# Patient Record
Sex: Male | Born: 1970 | Race: Black or African American | Hispanic: No | Marital: Single
Health system: Midwestern US, Community
[De-identification: ages and names within clinical notes are randomized; demographics above are authoritative.]

## PROBLEM LIST (undated history)

## (undated) DIAGNOSIS — W3400XA Accidental discharge from unspecified firearms or gun, initial encounter: Secondary | ICD-10-CM

## (undated) DIAGNOSIS — E7800 Pure hypercholesterolemia, unspecified: Secondary | ICD-10-CM

## (undated) DIAGNOSIS — I639 Cerebral infarction, unspecified: Secondary | ICD-10-CM

## (undated) DIAGNOSIS — I10 Essential (primary) hypertension: Secondary | ICD-10-CM

## (undated) DIAGNOSIS — E119 Type 2 diabetes mellitus without complications: Secondary | ICD-10-CM

## (undated) DIAGNOSIS — Y249XXA Unspecified firearm discharge, undetermined intent, initial encounter: Secondary | ICD-10-CM

## (undated) HISTORY — PX: SPLENECTOMY: SUR1306

## (undated) HISTORY — PX: ABDOMINAL SURGERY: SHX537

## (undated) HISTORY — PX: KIDNEY SURGERY: SHX687

---

## 2015-05-12 ENCOUNTER — Encounter (HOSPITAL_BASED_OUTPATIENT_CLINIC_OR_DEPARTMENT_OTHER): Payer: Self-pay

## 2015-05-12 ENCOUNTER — Emergency Department (HOSPITAL_BASED_OUTPATIENT_CLINIC_OR_DEPARTMENT_OTHER)
Admission: EM | Admit: 2015-05-12 | Discharge: 2015-05-12 | Disposition: A | Discharge status: Home / Self Care | Payer: Self-pay | Attending: Emergency Medicine | Admitting: Emergency Medicine

## 2015-05-12 ENCOUNTER — Emergency Department (HOSPITAL_BASED_OUTPATIENT_CLINIC_OR_DEPARTMENT_OTHER): Payer: Self-pay

## 2015-05-12 DIAGNOSIS — Z8673 Personal history of transient ischemic attack (TIA), and cerebral infarction without residual deficits: Secondary | ICD-10-CM | POA: Insufficient documentation

## 2015-05-12 DIAGNOSIS — F17210 Nicotine dependence, cigarettes, uncomplicated: Secondary | ICD-10-CM | POA: Insufficient documentation

## 2015-05-12 DIAGNOSIS — I10 Essential (primary) hypertension: Secondary | ICD-10-CM | POA: Insufficient documentation

## 2015-05-12 DIAGNOSIS — R21 Rash and other nonspecific skin eruption: Secondary | ICD-10-CM | POA: Insufficient documentation

## 2015-05-12 DIAGNOSIS — K122 Cellulitis and abscess of mouth: Secondary | ICD-10-CM | POA: Insufficient documentation

## 2015-05-12 DIAGNOSIS — J020 Streptococcal pharyngitis: Secondary | ICD-10-CM | POA: Insufficient documentation

## 2015-05-12 DIAGNOSIS — H109 Unspecified conjunctivitis: Secondary | ICD-10-CM | POA: Insufficient documentation

## 2015-05-12 DIAGNOSIS — E119 Type 2 diabetes mellitus without complications: Secondary | ICD-10-CM | POA: Insufficient documentation

## 2015-05-12 DIAGNOSIS — Z79899 Other long term (current) drug therapy: Secondary | ICD-10-CM | POA: Insufficient documentation

## 2015-05-12 DIAGNOSIS — Z87828 Personal history of other (healed) physical injury and trauma: Secondary | ICD-10-CM | POA: Insufficient documentation

## 2015-05-12 HISTORY — DX: Accidental discharge from unspecified firearms or gun, initial encounter: W34.00XA

## 2015-05-12 HISTORY — DX: Type 2 diabetes mellitus without complications: E11.9

## 2015-05-12 HISTORY — DX: Cerebral infarction, unspecified: I63.9

## 2015-05-12 HISTORY — DX: Pure hypercholesterolemia, unspecified: E78.00

## 2015-05-12 HISTORY — DX: Essential (primary) hypertension: I10

## 2015-05-12 HISTORY — DX: Unspecified firearm discharge, undetermined intent, initial encounter: Y24.9XXA

## 2015-05-12 LAB — BASIC METABOLIC PANEL
Anion gap: 9 (ref 5–15)
BUN: 11 mg/dL (ref 6–20)
CALCIUM: 9.1 mg/dL (ref 8.9–10.3)
CO2: 31 mmol/L (ref 22–32)
CREATININE: 1.19 mg/dL (ref 0.61–1.24)
Chloride: 101 mmol/L (ref 101–111)
GFR calc Af Amer: >60 mL/min (ref >60)
GFR calc non Af Amer: >60 mL/min (ref >60)
GLUCOSE: 100 mg/dL — AB (ref 65–99)
Potassium: 4.6 mmol/L (ref 3.5–5.1)
Sodium: 141 mmol/L (ref 135–145)

## 2015-05-12 LAB — CBC WITH DIFFERENTIAL/PLATELET
BASOS PCT: 0 %
Basophils Absolute: 0 10*3/uL (ref 0.0–0.1)
EOS ABS: 0.2 10*3/uL (ref 0.0–0.7)
Eosinophils Relative: 2 %
HCT: 47.1 % (ref 39.0–52.0)
Hemoglobin: 15.9 g/dL (ref 13.0–17.0)
Lymphocytes Relative: 25 %
Lymphs Abs: 2.8 10*3/uL (ref 0.7–4.0)
MCH: 31.9 pg (ref 26.0–34.0)
MCHC: 33.8 g/dL (ref 30.0–36.0)
MCV: 94.6 fL (ref 78.0–100.0)
MONO ABS: 1 10*3/uL (ref 0.1–1.0)
MONOS PCT: 9 %
Neutro Abs: 7.2 10*3/uL (ref 1.7–7.7)
Neutrophils Relative %: 64 %
Platelets: 148 10*3/uL — ABNORMAL LOW (ref 150–400)
RBC: 4.98 MIL/uL (ref 4.22–5.81)
RDW: 14.3 % (ref 11.5–15.5)
WBC: 11.2 10*3/uL — ABNORMAL HIGH (ref 4.0–10.5)

## 2015-05-12 LAB — RAPID STREP SCREEN (MED CTR MEBANE ONLY): Streptococcus, Group A Screen (Direct): POSITIVE — AB

## 2015-05-12 MED ORDER — CLINDAMYCIN PHOSPHATE 600 MG/50ML IV SOLN
600.0000 mg | Freq: Once | INTRAVENOUS | Status: AC
  Administered 2015-05-12: 600 mg via INTRAVENOUS
  Filled 2015-05-12: qty 50

## 2015-05-12 MED ORDER — DEXAMETHASONE SODIUM PHOSPHATE 10 MG/ML IJ SOLN
10.0000 mg | Freq: Once | INTRAMUSCULAR | Status: AC
  Administered 2015-05-12: 10 mg via INTRAVENOUS
  Filled 2015-05-12: qty 1

## 2015-05-12 MED ORDER — IOHEXOL 300 MG/ML  SOLN
75.0000 mL | Freq: Once | INTRAMUSCULAR | Status: AC | PRN
  Administered 2015-05-12: 75 mL via INTRAVENOUS

## 2015-05-12 MED ORDER — ERYTHROMYCIN 5 MG/GM OP OINT
1.0000 | TOPICAL_OINTMENT | Freq: Every day | OPHTHALMIC | Status: AC
Start: 2015-05-12 — End: ?

## 2015-05-12 MED ORDER — CLINDAMYCIN HCL 300 MG PO CAPS: 300.0000 mg | ORAL_CAPSULE | Freq: Three times a day (TID) | ORAL | Status: AC

## 2015-05-12 MED ORDER — TETRACAINE HCL 0.5 % OP SOLN
2.0000 [drp] | Freq: Once | OPHTHALMIC | Status: AC
  Administered 2015-05-12: 2 [drp] via OPHTHALMIC
  Filled 2015-05-12: qty 4

## 2015-05-12 MED ORDER — FLUORESCEIN SODIUM 1 MG OP STRP
1.0000 | ORAL_STRIP | Freq: Once | OPHTHALMIC | Status: AC
  Administered 2015-05-12: 1 via OPHTHALMIC
  Filled 2015-05-12: qty 1

## 2015-05-12 MED ORDER — PENICILLIN V POTASSIUM 500 MG PO TABS: 500.0000 mg | ORAL_TABLET | Freq: Three times a day (TID) | ORAL | Status: DC

## 2015-05-12 MED ORDER — NYSTATIN 100000 UNIT/GM EX POWD: 1.0000 g | Freq: Two times a day (BID) | CUTANEOUS | Status: AC

## 2015-05-12 MED FILL — ERYTHROMYCIN EYE OINTMENT: 5 | 10 days supply | Qty: 4 | Fill #0

## 2015-05-12 MED FILL — CLINDAMYCIN HCL 300 MG CAP: 300 | 10 days supply | Qty: 30 | Fill #0

## 2015-05-12 NOTE — ED Notes (Signed)
Patient returned from CT via ambulation.

## 2015-05-12 NOTE — ED Notes (Signed)
Patient transported to CT 

## 2015-05-12 NOTE — ED Provider Notes (Addendum)
By signing my name below, I, Sonum Patel, attest that this documentation has been prepared under the direction and in the presence of Glynn OctaveStephen Crystalyn Delia, MD. Electronically Signed: Sonum Patel, Neurosurgeoncribe. 05/12/2015. 12:19 PM.  I saw and evaluated the patient, reviewed the resident's note and I agree with the findings and plan. If applicable, I agree with the resident's interpretation of the EKG.  If applicable, I was present for critical portions of any procedures performed.  HPI Comments: Patrick FortsKeith Barker is a 45 y.o. male who presents to the Emergency Department complaining of 1 week of gradual onset, gradually worsened, constant sore throat that is worse with swallowing and at night. He also complains of bilateral eye redness and blurry vision for the last couple of days. He denies sick contacts with similar symptoms. He denies wearing eyeglasses or contact lenses. He denies cough, vomiting, HA, fever, CP, SOB.   Inflamed left conjunctiva. PERRL, EOM intact without pain. No orbital cellulitis. Erythematous oro with enlarged uvula. No asymmetry. Tolerating secretions.   Bilateral Distance: 20/30 ; R Distance: 20/30 ; L Distance: 20/40 fluoroscein stain negative.  IOP 18 on R.  Uvula is midline. There is no evidence of peritonsillar abscess or retropharyngeal abscess. Imaging obtained which shows no evidence of abscess. Feels improved after IV Decadron is tolerating by mouth. There is no difficulty breathing or swallowing.  Treat with clindamycin for pharyngitis and uvulitis. Received IV steroids in the ED. Return precautions discussed.  I personally performed the services described in this documentation, which was scribed in my presence. The recorded information has been reviewed and is accurate.   Glynn OctaveStephen Jae Skeet, MD 05/12/15 1642  Glynn OctaveStephen Mianna Iezzi, MD 05/12/15 Windy [id number]

## 2015-05-12 NOTE — ED Notes (Signed)
Pt presents with sore throat and eye redness and periorbital swelling.
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
## 2015-05-12 NOTE — Discharge Instructions (Signed)
Follow up with [facility] to establish care You were diagnosed with Strep Throat and an Left conjunctivitis If you have fevers, worsening throat or neck pain or are unable to tolerate swallowing, or develop right eye pain or decreased vision return to the ED for evaluation

## 2017-08-28 IMAGING — CT CT ORBITS W/O CM
3 series · 14 of 47 positions shown, 16 images · non-contrast
Comparison: None.

CLINICAL DATA: Sore throat and red eyes with periorbital swelling
on the left

EXAM:
CT ORBITS WITHOUT CONTRAST
TECHNIQUE: Multidetector CT imaging of the orbits was performed following the
standard protocol without intravenous contrast.

[Series 3: orbits 2.0 h30s st · axial · 0.30mm/px · z∈[-170,-100]mm · 8 of 41 slices shown, 10 images]
[im 3/41  brain]
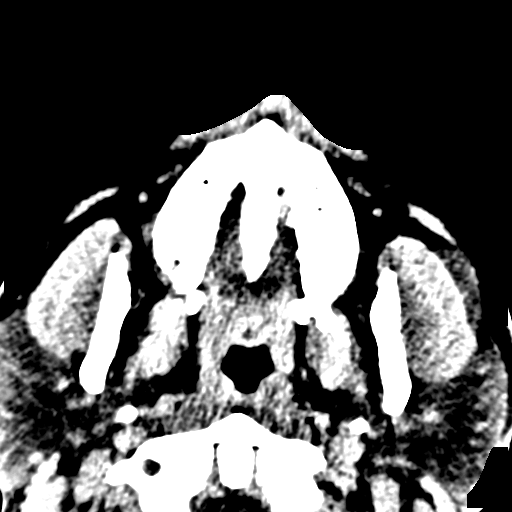
[im 3/41  bone]
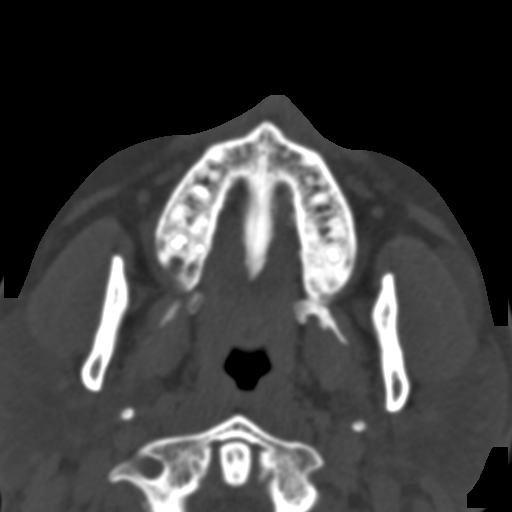
[im 9/41  bone]
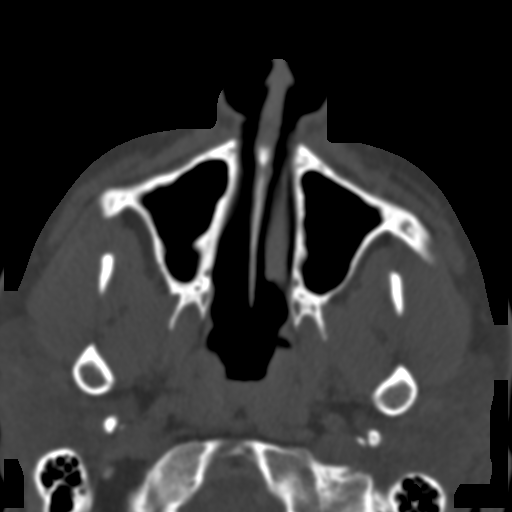
[im 13/41  bone]
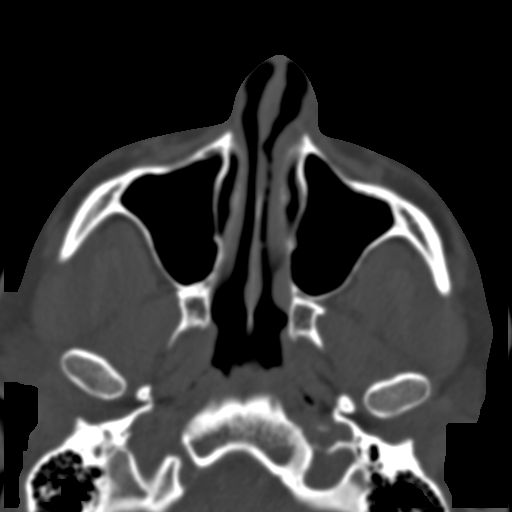
[im 18/41  bone]
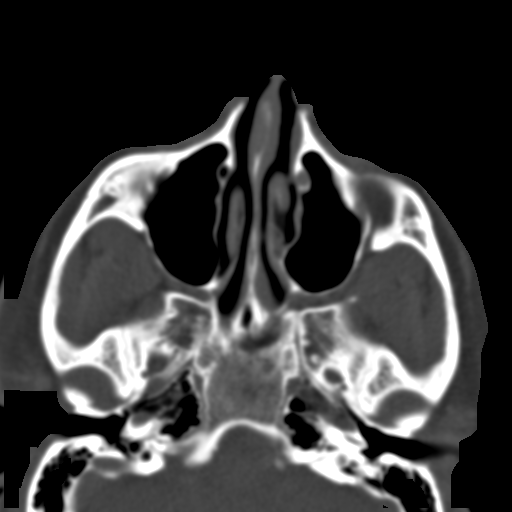
[im 23/41  brain]
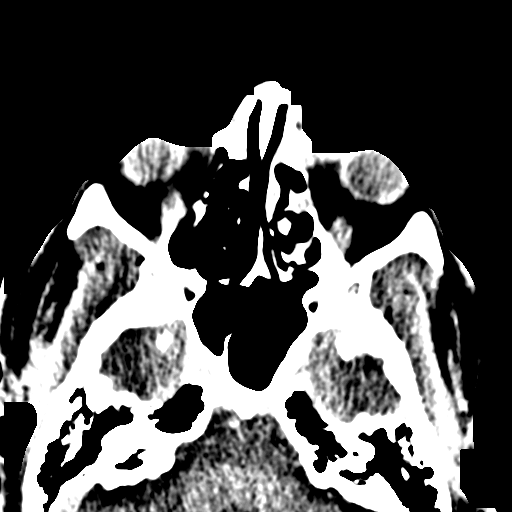
[im 23/41  bone]
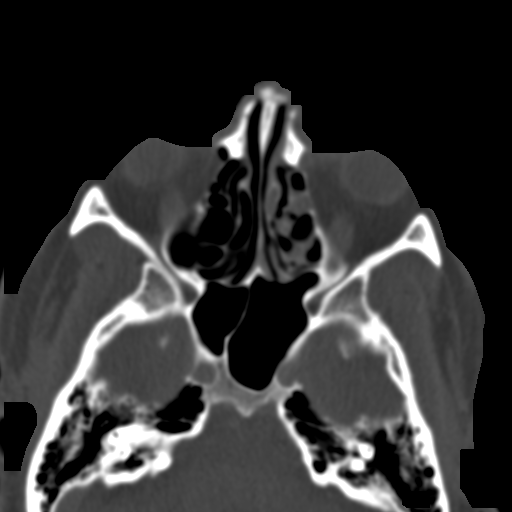
[im 28/41  bone]
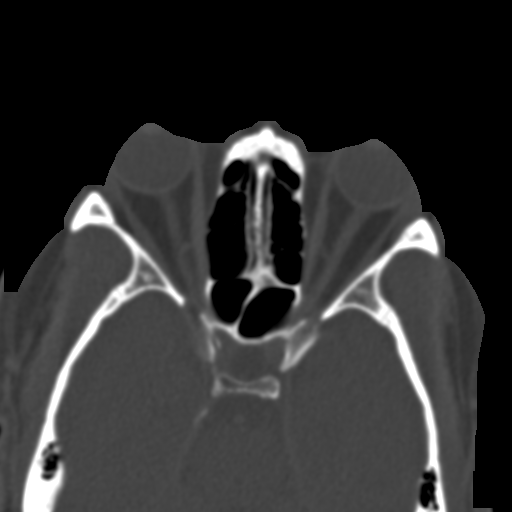
[im 32/41  bone]
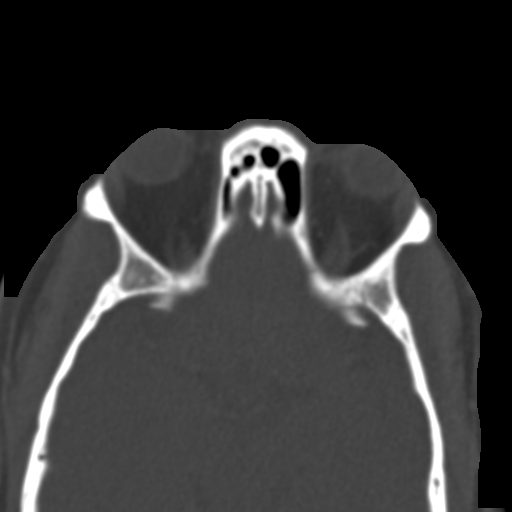
[im 38/41  bone]
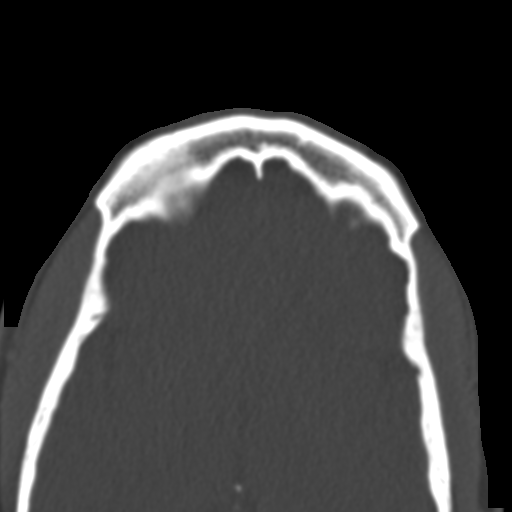

[Series 8: orbits 2.0 coronal · coronal · 0.18mm/px · 3 of 74 slices shown]
[im 25/74  bone]
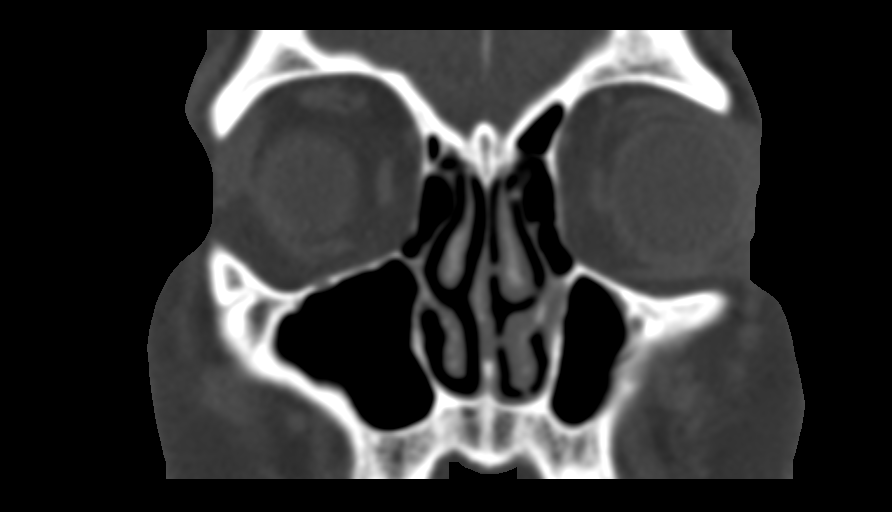
[im 33/74  bone]
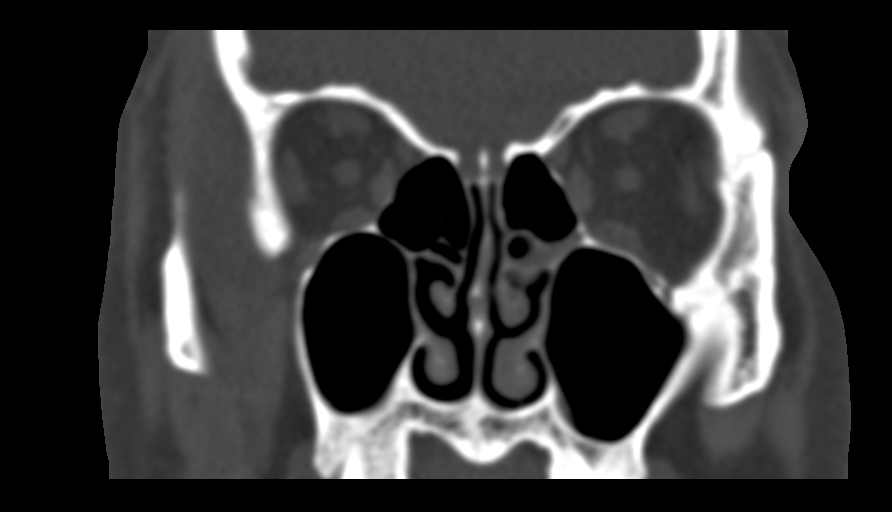
[im 41/74  bone]
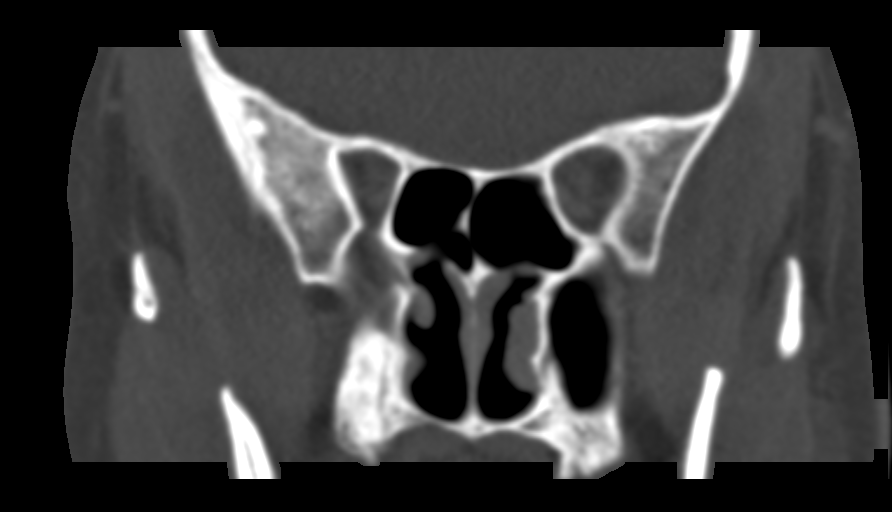

[Series 9: orbits 2.0 sagittal · sagittal · 0.19mm/px · 3 of 89 slices shown]
[im 30/89  bone]
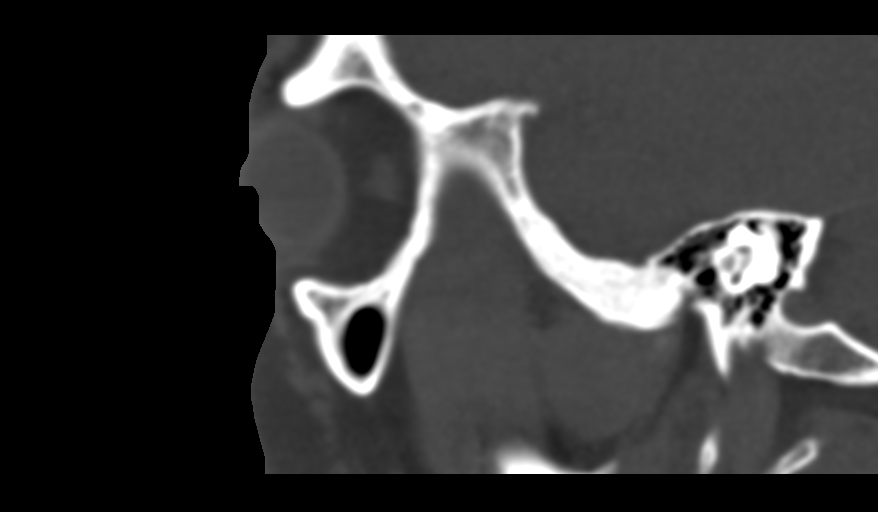
[im 45/89  bone]
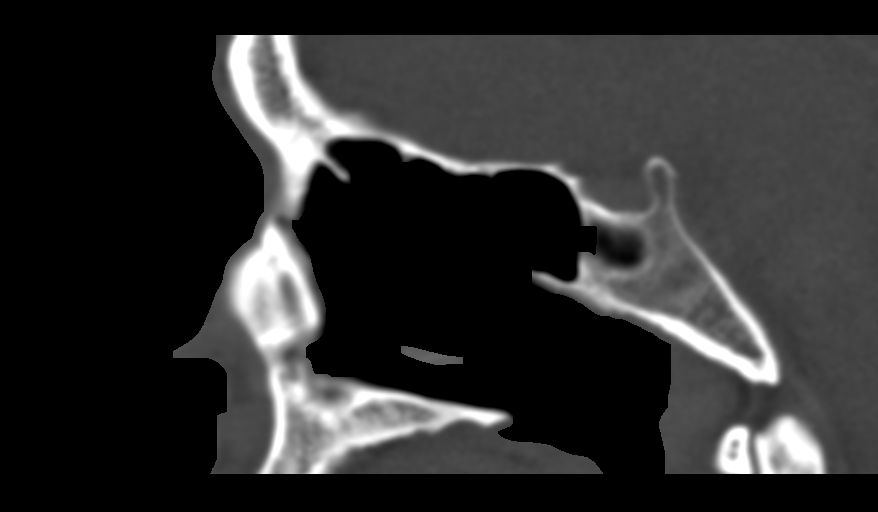
[im 59/89  bone]
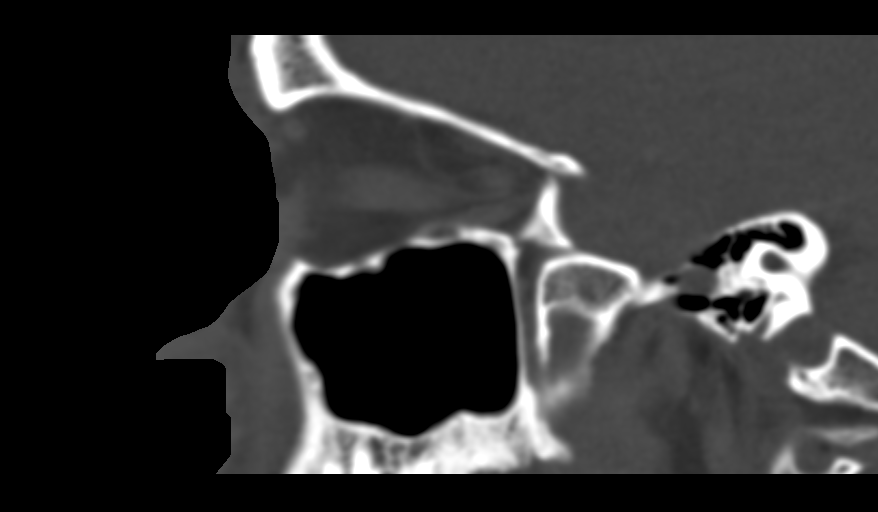

[14 of 47 positions shown; findings below may reference images not displayed]

FINDINGS: The maxillary sinuses and sphenoid sinuses are clear. Several
anterior posterior left ethmoid air cells are opacified. Frontal
sinuses are not developed. Visualized portions of the mastoid air
cells are clear.

No evidence of acute osseous abnormality. No evidence of abnormality
involving either globe or retro bulb are area of the orbit. Mild
preseptal soft tissue swelling laterally involving the left eye
delayed.
IMPRESSION: Left ethmoid air cell opacification consistent with sinusitis. Mild
nonspecific soft tissue thickening suggesting mild preseptal
periorbital cellulitis laterally on the left.

## 2017-08-28 IMAGING — CT CT NECK W/ CM
3 of 4 series · 12 of 33 positions shown, 14 images · IV contrast (omnipaque)
Comparison: None.

CLINICAL DATA: 44-year-old diabetic hypertensive male presenting
with sore throat and redness left thigh. Symptoms for 1 week.
Initial encounter.

EXAM:
CT NECK WITH CONTRAST
TECHNIQUE: Multidetector CT imaging of the neck was performed using the
standard protocol following the bolus administration of intravenous
contrast.
CONTRAST:  75mL OMNIPAQUE IOHEXOL 300 MG/ML  SOLN

[Series 4: sag neck · sagittal · 0.51mm/px · 5 of 104 slices shown, 6 images]
[im 35/104  bone]
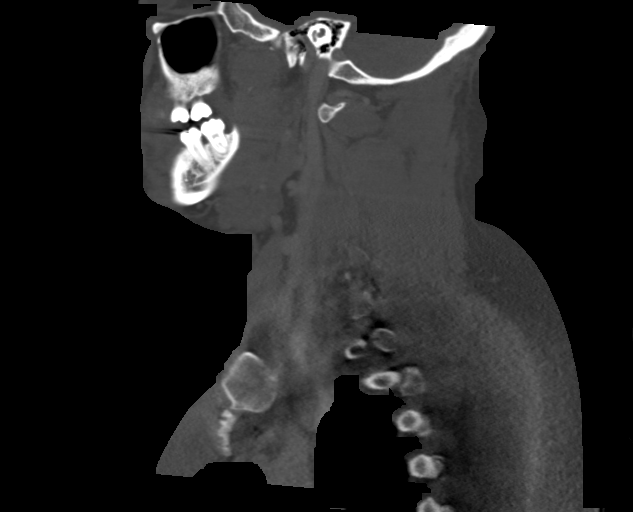
[im 43/104  bone]
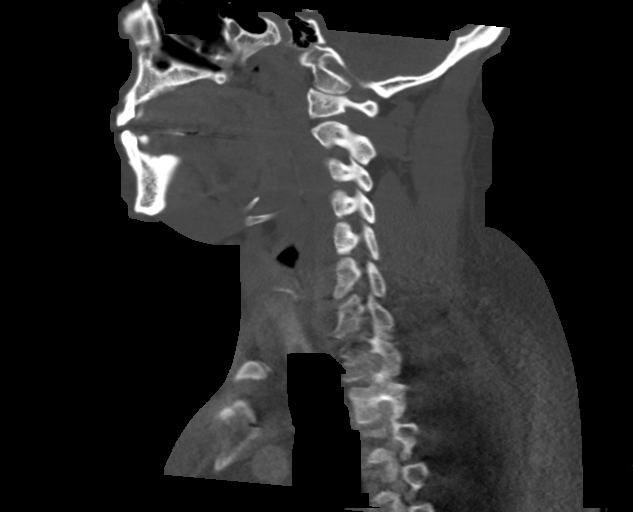
[im 52/104  soft-tissue]
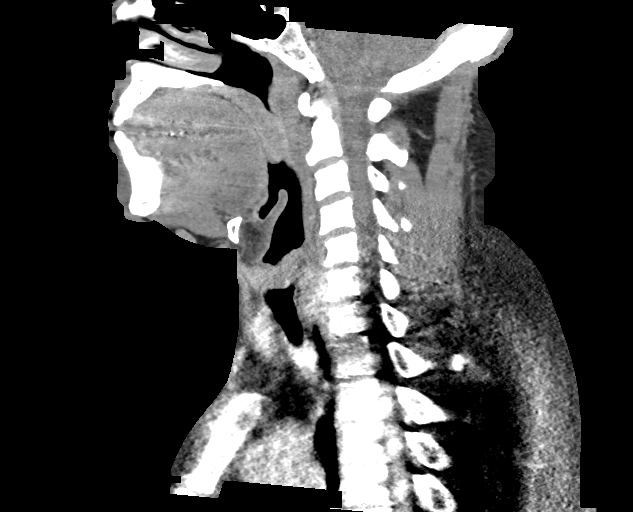
[im 52/104  bone]
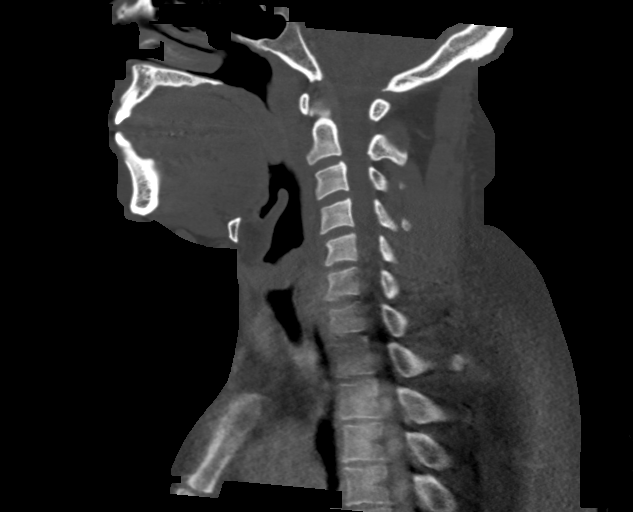
[im 61/104  bone]
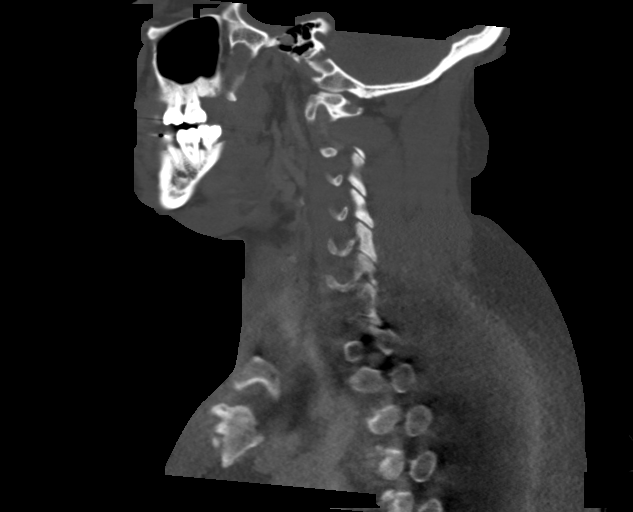
[im 69/104  bone]
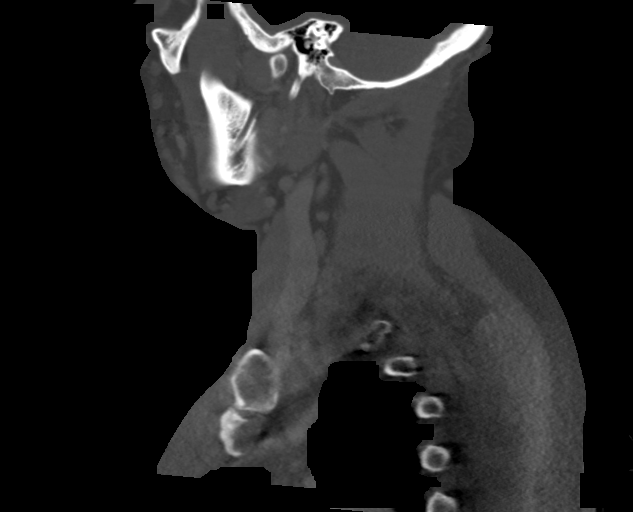

[Series 5: cor neck · coronal · 0.45mm/px · 3 of 151 slices shown]
[im 31/151  bone]
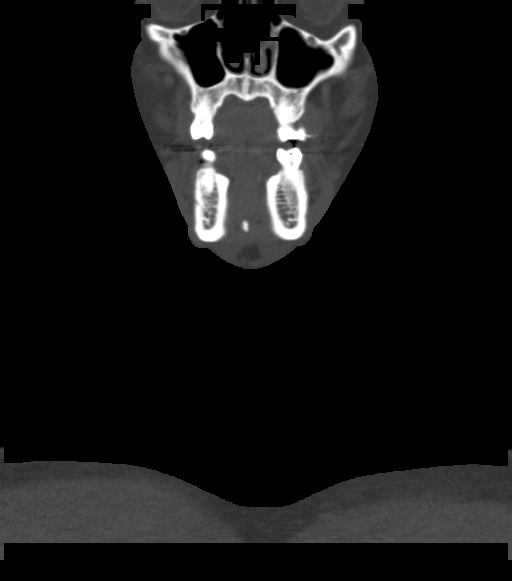
[im 61/151  bone]
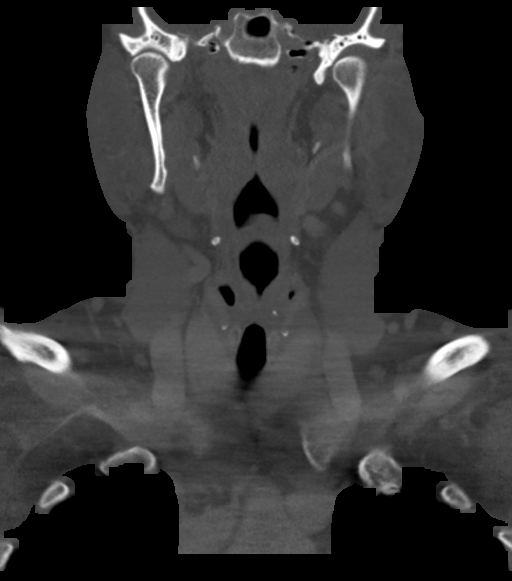
[im 91/151  bone]
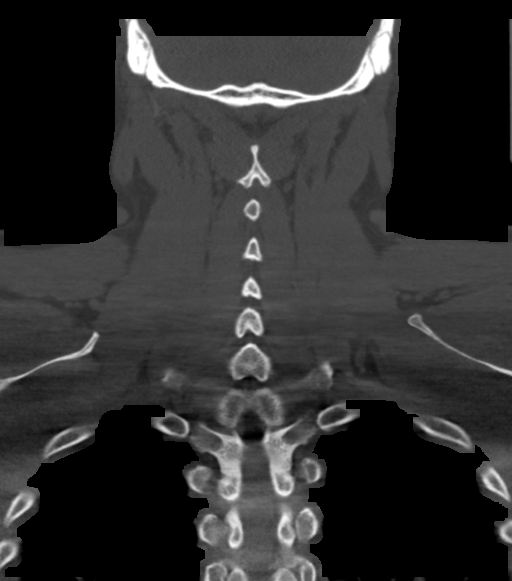

[Series 6: orthogonal ax · axial · 0.39mm/px · z∈[-402,-219]mm · 4 of 139 slices shown, 5 images]
[im 24/139  soft-tissue]
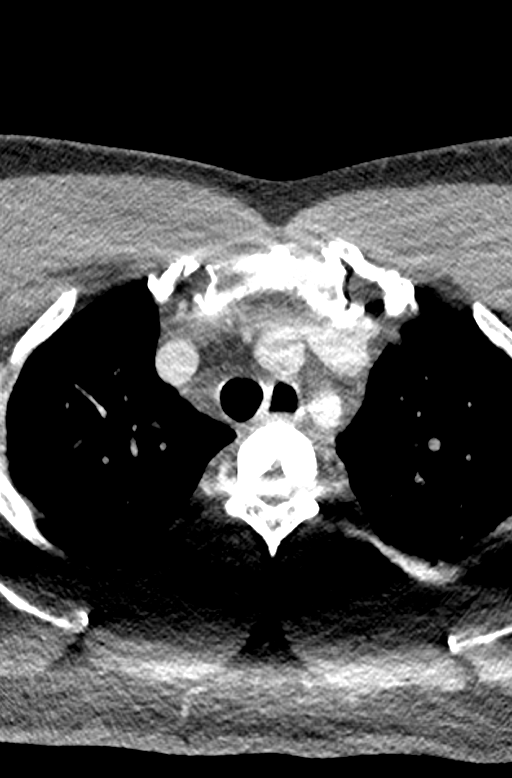
[im 24/139  bone]
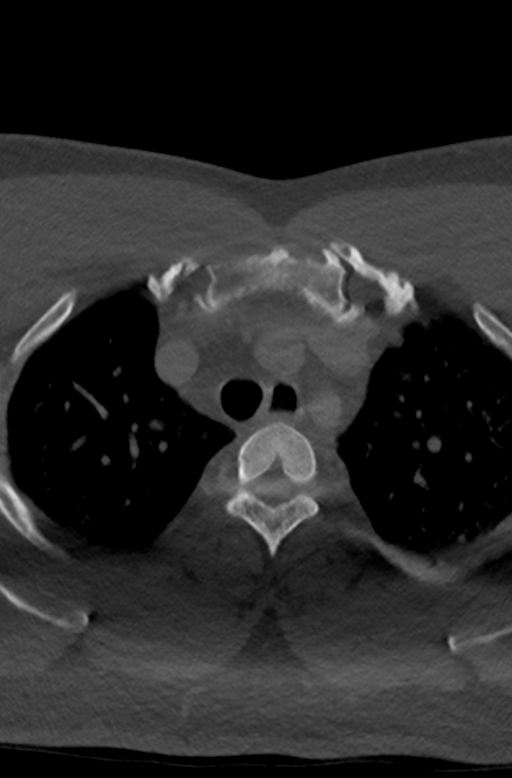
[im 47/139  bone]
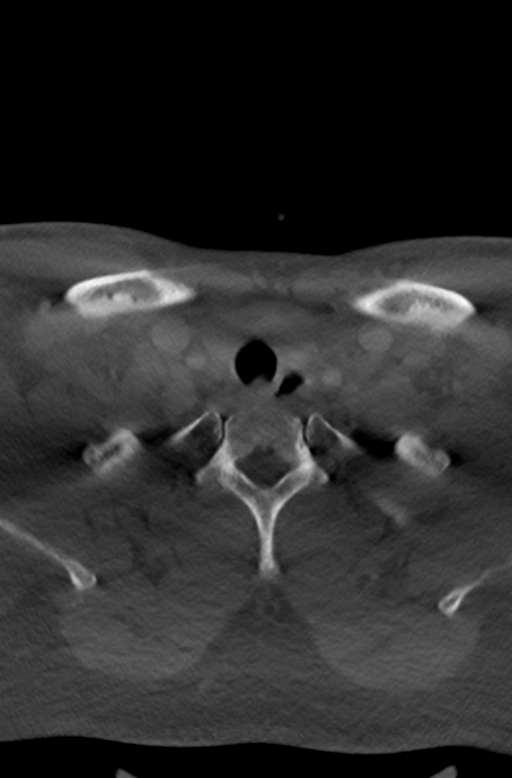
[im 93/139  bone]
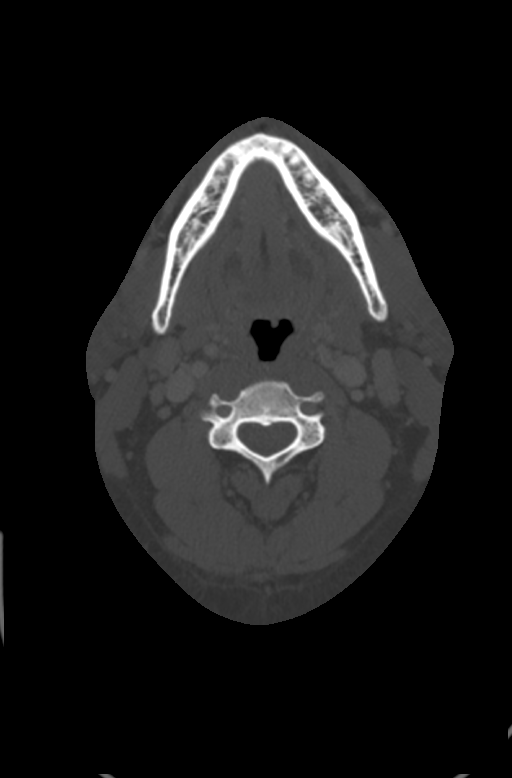
[im 116/139  bone]
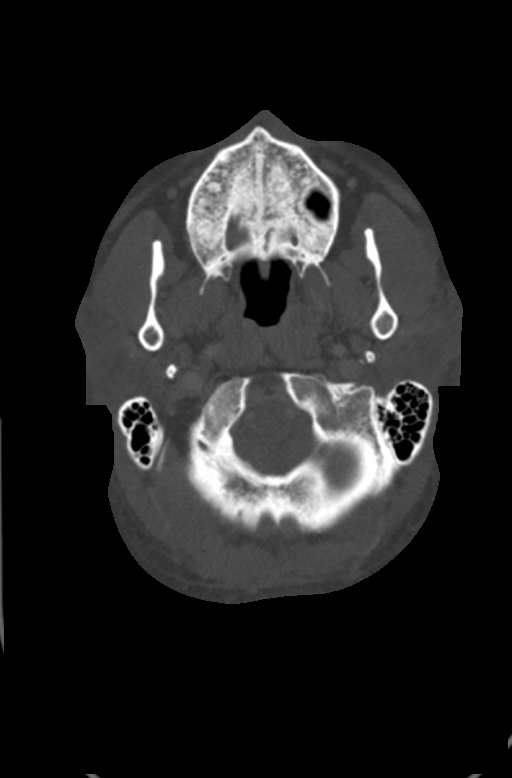

[12 of 33 positions shown; findings below may reference images not displayed]

FINDINGS: Pharynx and larynx: Circumferential swelling of the palatine tonsils
consistent with clinical history tonsillitis with narrowing of the
air column. There is inflammation of the soft palate/ uvula which
also correlates with clinical exam. No retropharyngeal or
parapharyngeal extension of inflammatory process. The epiglottis and
aryepiglottic folds are top-normal in size without inflammation or
significant narrowing of the air column.

Salivary glands: Negative.

Thyroid: Evaluation limited by streak artifact.  No obvious mass.

Lymph nodes: Increased number of normal size to enlarged lymph nodes
largest level 2 region probably reactive in origin.

Vascular: No thrombosis internal jugular veins. No high-grade
stenosis carotid bifurcation. Calcified internal carotid artery
cavernous segment bilaterally.

Limited intracranial: Negative.

Visualized orbits: There may be mild left preseptal soft tissue
swelling. The orbits were not completely imaged on the present exam.
Exophthalmos noted bilaterally. Limited imaging of retro-orbital fat
without obvious inflammation.

Mastoids and visualized paranasal sinuses: Visualized aspects clear.

Skeleton: Evaluation of cervical spine limited by patient's habitus.
Mild lucency apex of the upper posterior molar bilaterally.

Upper chest: Evaluation limited by habitus and artifact. No gross
abnormality
IMPRESSION: Circumferential swelling of the palatine tonsils consistent with
clinical history of tonsillitis with narrowing of the air column.
Inflammation of the soft palate/ uvula which also correlates with
clinical exam. No retropharyngeal or parapharyngeal extension of
inflammatory process.

Increased number of normal size to enlarged lymph nodes largest
level 2 region probably reactive in origin.

Suggestion of mild left preseptal soft tissue swelling. The orbits
were not completely imaged on the present exam. Exophthalmos noted
bilaterally. Limited imaging of retro-orbital fat without obvious
inflammation.

These results were called by telephone at the time of interpretation
on 05/12/2015 at [DATE] to Dr. BREAVE MAURICE , who verbally
acknowledged these results.

## 2022-04-15 ENCOUNTER — Inpatient Hospital Stay
Admit: 2022-04-15 | Discharge: 2022-04-15 | Disposition: A | Discharge status: Home / Self Care | Attending: Emergency Medicine

## 2022-04-15 ENCOUNTER — Emergency Department: Admit: 2022-04-15

## 2022-04-15 DIAGNOSIS — R070 Pain in throat: Secondary | ICD-10-CM

## 2022-04-15 LAB — CBC WITH AUTO DIFFERENTIAL
Basophils %: 1 % (ref 0–1)
Basophils Absolute: 0.1 10*3/uL (ref 0.0–0.1)
Eosinophils %: 2 % (ref 0–7)
Eosinophils Absolute: 0.1 10*3/uL (ref 0.0–0.4)
Hematocrit: 45.7 % (ref 36.6–50.3)
Hemoglobin: 15 g/dL (ref 12.1–17.0)
Immature Granulocytes %: 0 % (ref 0.0–0.5)
Immature Granulocytes Absolute: 0 10*3/uL (ref 0.00–0.04)
Lymphocytes %: 43 % (ref 12–49)
Lymphocytes Absolute: 3.2 10*3/uL (ref 0.8–3.5)
MCH: 31.4 PG (ref 26.0–34.0)
MCHC: 32.8 g/dL (ref 30.0–36.5)
MCV: 95.6 FL (ref 80.0–99.0)
MPV: 9.6 FL (ref 8.9–12.9)
Monocytes %: 8 % (ref 5–13)
Monocytes Absolute: 0.6 10*3/uL (ref 0.0–1.0)
Neutrophils %: 46 % (ref 32–75)
Neutrophils Absolute: 3.3 10*3/uL (ref 1.8–8.0)
Nucleated RBCs: 0 PER 100 WBC
Platelets: 200 10*3/uL (ref 150–400)
RBC: 4.78 M/uL (ref 4.10–5.70)
RDW: 13.5 % (ref 11.5–14.5)
WBC: 7.3 10*3/uL (ref 4.1–11.1)
nRBC: 0 10*3/uL (ref 0.00–0.01)

## 2022-04-15 LAB — BASIC METABOLIC PANEL
Anion Gap: 10 mmol/L (ref 5–15)
BUN/Creatinine Ratio: 14 (ref 12–20)
BUN: 16 MG/DL (ref 6–20)
CO2: 21 mmol/L (ref 21–32)
Calcium: 8.9 MG/DL (ref 8.5–10.1)
Chloride: 109 mmol/L — ABNORMAL HIGH (ref 97–108)
Creatinine: 1.12 MG/DL (ref 0.70–1.30)
Est, Glom Filt Rate: >60 mL/min/{1.73_m2} (ref >60)
Glucose: 110 mg/dL — ABNORMAL HIGH (ref 65–100)
Potassium: 3.5 mmol/L (ref 3.5–5.1)
Sodium: 140 mmol/L (ref 136–145)

## 2022-04-15 MED ORDER — PREDNISONE 20 MG PO TABS
20 | ORAL_TABLET | Freq: Every day | ORAL | 0 refills | Status: AC
Start: 2022-04-15 — End: 2022-04-22

## 2022-04-15 MED ORDER — IOPAMIDOL 76 % IV SOLN
76 | Freq: Once | INTRAVENOUS | Status: AC | PRN
Start: 2022-04-15 — End: 2022-04-15
  Administered 2022-04-15: 16:00:00 100 mL via INTRAVENOUS

## 2022-04-15 MED ORDER — MORPHINE SULFATE (PF) 10 MG/ML IJ SOLN
10 | INTRAMUSCULAR | Status: AC
Start: 2022-04-15 — End: 2022-04-15
  Administered 2022-04-15: 15:00:00 6 mg via INTRAVENOUS

## 2022-04-15 MED ORDER — AMOXICILLIN-POT CLAVULANATE 875-125 MG PO TABS
875-125 | ORAL_TABLET | Freq: Two times a day (BID) | ORAL | 0 refills | Status: AC
Start: 2022-04-15 — End: 2022-04-22

## 2022-04-15 MED ORDER — PREDNISONE 20 MG PO TABS
20 | ORAL_TABLET | Freq: Every day | ORAL | 0 refills | Status: DC
Start: 2022-04-15 — End: 2022-04-15

## 2022-04-15 MED FILL — MORPHINE SULFATE 10 MG/ML IV SOLN: 10 mg/mL | INTRAVENOUS | Qty: 1

## 2022-04-15 MED FILL — ISOVUE-370 76 % IV SOLN: 76 % | INTRAVENOUS | Qty: 100

## 2022-04-15 NOTE — ED Provider Notes (Signed)
EMERGENCY DEPARTMENT HISTORY AND PHYSICAL EXAM    Date: 04/15/2022  Patient Name: Richard Holden  Patient Age and Sex: 52 y.o. male  MRN:  [id number]  CSN:  [id number]    History of Present Illness     Chief Complaint   Patient presents with    Neck Pain     Right side of neck pain, with a knot felt radiating to right side teeth for a month. "It hurts so bad it makes me sweat"-reports the throat is not sore. Hurts in the area to swallow to touch it or to lay on that side       History Provided By: Patient    Ability to gather history was limited by:     HPI: Richard Holden, 52 y.o. male   With no significant past medical history complains of a sensation of pain and swelling and a "knot" at the base of his throat which has been worsening for about 1 month.  He has some mild voice changes and hoarseness associated with it.  Mild pain with swallowing.  Hurts to chew or touch the area at the base of the throat above the thyroid cartilage.  No reported fevers but he has had some intermittent mild diaphoresis which she attributes to the level of pain.      Tobacco Use      Smoking status: Not on file      Smokeless tobacco: Not on file     Past History   The patient's medical, surgical, and social history were reviewed by me today.    Current Medications:  No current facility-administered medications on file prior to encounter.     No current outpatient medications on file prior to encounter.       No past medical history on file.    No past surgical history on file.         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  Pertinent positive and negative elements are noted in the HPI and MDM sections.    Review of Systems   Constitutional:  Positive for diaphoresis. Negative for fatigue and fever.   HENT:   Positive for sore throat and trouble swallowing.    All other systems reviewed and are negative.      Physical Exam   Vital Signs  Patient Vitals for the past 8 hrs:   BP SpO2   04/15/22 1249 -- 98 %   04/15/22 1116 -- 99 %   04/15/22 1115 (!) 137/95 --          Physical Exam  Vitals reviewed.   Constitutional:       General: He is not in acute distress.     Appearance: Normal appearance. He is not ill-appearing.   HENT:      Mouth/Throat:      Dentition: No dental abscesses.      Tongue: No lesions. Tongue does not deviate from midline.      Pharynx: Oropharynx is clear. No pharyngeal swelling, oropharyngeal exudate or posterior oropharyngeal erythema.      Tonsils: No tonsillar abscesses.      Comments: No apparent peritonsillar abscess.  Visible oropharynx is  normal  Neck:      Thyroid: No thyroid mass.      Trachea: Tracheal tenderness present.        Comments: Tenderness in indicated area, no significant visible swelling  Cardiovascular:      Rate and Rhythm: Normal rate and regular rhythm.   Pulmonary:      Effort: Pulmonary effort is normal. No respiratory distress.      Breath sounds: No wheezing or rhonchi.   Abdominal:      General: Abdomen is flat. There is no distension.      Palpations: Abdomen is soft.      Tenderness: There is no abdominal tenderness.   Lymphadenopathy:      Cervical: No cervical adenopathy.   Skin:     General: Skin is warm and dry.      Findings: No erythema.   Neurological:      General: No focal deficit present.      Mental Status: He is alert and oriented to person, place, and time.         Diagnostic Study Results   Labs  Recent Results (from the past 24 hour(s))   CBC with Auto Differential    Collection Time: 04/15/22 10:23 AM   Result Value Ref Range    WBC 7.3 4.1 - 11.1 K/uL    RBC 4.78 4.10 - 5.70 M/uL    Hemoglobin 15.0 12.1 - 17.0 g/dL    Hematocrit 01.0 93.2 - 50.3 %    MCV 95.6 80.0 - 99.0 FL    MCH 31.4 26.0 - 34.0 PG    MCHC 32.8 30.0 - 36.5 g/dL    RDW 35.5 73.2 -  20.2 %    Platelets 200 150 - 400 K/uL    MPV 9.6 8.9 - 12.9 FL    Nucleated RBCs 0.0 0 PER 100 WBC    nRBC 0.00 0.00 - 0.01 K/uL    Neutrophils % 46 32 - 75 %    Lymphocytes % 43 12 - 49 %    Monocytes % 8 5 - 13 %    Eosinophils % 2 0 - 7 %    Basophils % 1 0 - 1 %    Immature Granulocytes 0 0.0 - 0.5 %    Neutrophils Absolute 3.3 1.8 - 8.0 K/UL    Lymphocytes Absolute 3.2 0.8 - 3.5 K/UL    Monocytes Absolute 0.6 0.0 - 1.0 K/UL    Eosinophils Absolute 0.1 0.0 - 0.4 K/UL    Basophils Absolute 0.1 0.0 - 0.1 K/UL    Absolute Immature Granulocyte 0.0 0.00 - 0.04 K/UL    Differential Type AUTOMATED     BMP    Collection Time: 04/15/22 10:23 AM   Result Value Ref Range    Sodium 140 136 - 145 mmol/L    Potassium 3.5 3.5 - 5.1 mmol/L    Chloride 109 (H) 97 - 108 mmol/L    CO2 21 21 - 32 mmol/L    Anion Gap 10 5 - 15 mmol/L    Glucose 110 (H) 65 - 100 mg/dL    BUN 16 6 - 20 MG/DL    Creatinine 5.42 7.06 - 1.30 MG/DL    Bun/Cre Ratio 14 12 - 20      Est, Glom Filt Rate >60 >60 ml/min/1.42m2    Calcium 8.9 8.5 - 10.1 MG/DL       ==============================================================    Radiologic Studies  CT SOFT TISSUE NECK W CONTRAST  Final Result   Normal neck soft tissue CT.             Critical Care and Billable Procedures   EKG reviewed by ED Physician Gwynneth Aliment in the absence of a cardiologist: Yes  EKG below was independently interpreted by me Marijean Bravo, MD)    Procedures    Medical Decision Making   I reviewed the patient's most recent Emergency Dept notes and diagnostic tests in formulating my MDM on today's visit.    Medications administered during ED course:  Medications   morphine (PF) injection 6 mg (6 mg IntraVENous Given 04/15/22 1021)   iopamidol (ISOVUE-370) 76 % injection 100 mL (100 mLs IntraVENous Given 04/15/22 1102)     Medical Decision Making // ED Course // Reassessment:  Richard Holden, 52 y.o. male With no significant past medical history complains of a sensation of pain and  swelling and a "knot" at the base of his throat which has been worsening for about 1 month.  He has some mild voice changes and hoarseness associated with it.  Mild pain with swallowing.  Hurts to chew or touch the area at the base of the throat above the thyroid cartilage.  No reported fevers but he has had some intermittent mild diaphoresis which she attributes to the level of pain.    On examination he has normal vital signs, afebrile.  He has mild palpable swelling and moderate tenderness above the thyroid cartilage at the base of the throat.  Voice is moderately hoarse.  There is no visible abnormality in the oropharynx.  No peritonsillar abscess.  No palpable lymphadenopathy.    Laboratories reassuring, no significant leukocytosis or other acute findings.  CT scan was obtained with IV contrast.  No acute findings.  No masses.  No abscess or tumors.    He is in no respiratory distress and can be safely discharged home.  I referred him to ENT for outpatient nasopharyngeal scope if he has continued symptoms.    I prescribed an empiric course of Augmentin and prednisone.      Final Diagnosis:   1. Throat pain        Additional documentation if relevant for this encounter     Diagnosis and Disposition     Disposition: Decision To Discharge 04/15/2022 12:31:48 PM     Final Diagnosis:   1. Throat pain           Medication List        START taking these medications      amoxicillin-clavulanate 875-125 MG per tablet  Commonly known as: AUGMENTIN  Take 1 tablet by mouth 2 times daily for 7 days     predniSONE 20 MG tablet  Commonly known as: DELTASONE  Take 2 tablets by mouth daily for 7 days               Where to Get Your Medications        These medications were sent to [id number] [place] [address]      Phone: [phone]   amoxicillin-clavulanate 875-125 MG per tablet  predniSONE 20 MG tablet           Follow up:  [facility] EMERGENCY DEPT  [place] [address]  [phone]        [address], [address]  [address]  [place] [address]  [phone]          [address] Barters, [address]  [address]  [place] [address]  [phone]             Disclaimers   I was the first provider for this patient on this visit.  To the best of my ability I reviewed relevant prior medical records, electrocardiograms, laboratories, and radiologic studies.    The patient's presenting problems were discussed, and the patient was in agreement with the care plan formulated and outlined with them.     Please note that this dictation was completed with Dragon voice recognition software. Quite often unanticipated grammatical, syntax, homophones, and other interpretive errors are inadvertently transcribed by the computer software.   Please disregard these errors and excuse any errors that have escaped final proofreading.    Marijean Bravo, MD    I personally performed the services described in this documentation on this date 04/15/22  for patient SALMAN WELLEN.      Marijean Bravo, MD  6:13 PM         Marijean Bravo, MD  04/15/22 [phone]

## 2022-04-15 NOTE — ED Notes (Signed)
Dr. Bernstein reviewed discharge instructions with the patient.  The patient verbalized understanding.  All questions and concerns were addressed.  The patient declined a wheelchair and is discharged ambulatory in the care of family members with instructions and prescriptions in hand.  Pt is alert and oriented x 4.  Respirations are clear and unlabored.

## 2022-04-15 NOTE — Discharge Instructions (Addendum)
Your CT scan today is reassuring and did not show any signs of a concerning mass or infection.  The cause of your pain is not entirely clear but I would recommend you take a course of prednisone and extra strength Tylenol and throat lozenges, and follow-up with the ear nose and throat specialist.  Also take Augmentin antibiotic x 1 week.    You may benefit from a scope which they can perform in the office to look directly at your vocal cords and throat.

## 2022-09-30 ENCOUNTER — Inpatient Hospital Stay
Admit: 2022-09-30 | Discharge: 2022-09-30 | Disposition: A | Discharge status: Home / Self Care | Payer: PRIVATE HEALTH INSURANCE | Attending: Emergency Medicine

## 2022-09-30 ENCOUNTER — Emergency Department: Admit: 2022-09-30 | Payer: PRIVATE HEALTH INSURANCE

## 2022-09-30 DIAGNOSIS — M109 Gout, unspecified: Secondary | ICD-10-CM

## 2022-09-30 MED ORDER — PREDNISONE 20 MG PO TABS
20 | ORAL_TABLET | Freq: Every day | ORAL | 0 refills | Status: AC
Start: 2022-09-30 — End: 2022-10-05

## 2022-09-30 MED ORDER — OXYCODONE HCL 5 MG PO TABS
5 | ORAL | Status: AC
Start: 2022-09-30 — End: 2022-09-30
  Administered 2022-09-30: 18:00:00 5 mg via ORAL

## 2022-09-30 MED ORDER — OXYCODONE HCL 5 MG PO TABS
5 | ORAL_TABLET | Freq: Four times a day (QID) | ORAL | 0 refills | Status: AC | PRN
Start: 2022-09-30 — End: 2022-10-03

## 2022-09-30 MED ORDER — PREDNISONE 20 MG PO TABS
20 | ORAL | Status: AC
Start: 2022-09-30 — End: 2022-09-30
  Administered 2022-09-30: 18:00:00 60 mg via ORAL

## 2022-09-30 MED FILL — PREDNISONE 20 MG PO TABS: 20 MG | ORAL | Qty: 3

## 2022-09-30 MED FILL — OXYCODONE HCL 5 MG PO TABS: 5 MG | ORAL | Qty: 1

## 2022-09-30 NOTE — ED Provider Notes (Shared)
[facility] EMER[facility] DEPT  EMERGENCY DEPARTMENT ENCOUNTER       Pt Name: Richard Holden  MRN: [id number]  Birthdate 1970-07-13  Date of evaluation: 09/30/2022  Provider: Gerrianne Scale, MD   PCP: No primary care provider on file.  Note Started: 1:50 PM EDT 09/30/22     CHIEF COMPLAINT       Chief Complaint   Patient presents with    Foot Pain     Patient ambulatory into triage complaining of R foot pain x3 days. Patient reports he believes it is gout and has a history of gout.         HISTORY OF PRESENT ILLNESS: 1 or more elements      History From: ***, History limited by: ***none     Richard Holden is a 52 y.o. male ***       Please See MDM for Additional Details of the HPI/PMH  Nursing Notes were all reviewed and agreed with or any disagreements were addressed in the HPI.     REVIEW OF SYSTEMS        Positives and Pertinent negatives as per HPI.    PAST HISTORY     Past Medical History:  No past medical history on file.    Past Surgical History:  No past surgical history on file.    Family History:  No family history on file.    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       Excl. in GC?         Physical Exam  Constitutional:       General: He is not in acute distress.     Appearance: Normal appearance. He is not ill-appearing.   HENT:      Head: Normocephalic and atraumatic.   Eyes:       Extraocular Movements: Extraocular movements intact.      Pupils: Pupils are equal, round, and reactive to light.   Cardiovascular:      Rate and Rhythm: Normal rate.   Pulmonary:      Effort: Pulmonary effort is normal. No respiratory distress.   Musculoskeletal:         General: Normal  range of motion.      Cervical back: Normal range of motion.      Right ankle: Swelling present. Tenderness present over the lateral malleolus and medial malleolus. Normal pulse.   Skin:     General: Skin is warm and dry.   Neurological:      General: No focal deficit present.      Mental Status: He is alert and oriented to person, place, and time.   Psychiatric:         Mood and Affect: Mood normal.         Behavior: Behavior normal.          DIAGNOSTIC RESULTS   LABS:     No results found for this or any previous visit (from the past 24 hour(s)).    EKG: If performed, independent interpretation documented below in the MDM section     RADIOLOGY:  Non-plain film images such as CT, Ultrasound and MRI are read by the radiologist. Plain radiographic images are visualized and preliminarily interpreted by the ED Provider with the findings documented in the MDM section.     Interpretation per the Radiologist below, if available at the time of this note:     XR ANKLE RIGHT (MIN 3 VIEWS)   Final Result   No acute abnormality.      Electronically signed by Carrolyn Leigh           PROCEDURES   Unless otherwise noted below, none  Procedures     CRITICAL CARE TIME   ***    EMERGENCY DEPARTMENT COURSE and DIFFERENTIAL DIAGNOSIS/MDM   Vitals:    Vitals:    09/30/22 1209 09/30/22 1230 09/30/22 1245   BP: (!) 160/99 137/82 135/79   Pulse: 92     Resp: 16     Temp: 97.7 F (36.5 C)     TempSrc: Oral     SpO2: 98% 96% 98%   Weight: 105.9 kg (233 lb 7.5 oz)     Height: 1.829 m (6')          Patient was given the following medications:  Medications   predniSONE (DELTASONE) tablet 60 mg (60 mg Oral Given 09/30/22 1350)   oxyCODONE (ROXICODONE) immediate  release tablet 5 mg (5 mg Oral Given 09/30/22 1350)       Medical Decision Making  Amount and/or Complexity of Data Reviewed  Radiology: ordered.    Risk  Prescription drug management.          CLINICAL MANAGEMENT TOOLS:  {CMT List:60667::"Not Applicable"}             Heart Score: ***  NIHSS: ***      FINAL IMPRESSION     1. Acute gout of right ankle, unspecified cause          DISPOSITION/PLAN   Richard Holden  results have been reviewed with him.  He has been counseled regarding his diagnosis, treatment, and plan.  He verbally conveys understanding and agreement of the signs, symptoms, diagnosis, treatment and prognosis and additionally agrees to follow up as discussed.  He also agrees with the care-plan and conveys that all of his questions have been answered.  I have also provided discharge instructions for him that include: educational information regarding their diagnosis and treatment, and list of reasons why they would want to return to the ED prior to their follow-up appointment, should his condition change.     CLINICAL IMPRESSION    {Disposition (Optional):57538}  PATIENT REFERRED TO:  your PCP    Schedule an appointment as soon as possible for a visit       [facility]  [address]  [place] [address]  [phone]    As needed, If symptoms worsen       DISCHARGE MEDICATIONS:     Medication List        START taking these medications      oxyCODONE 5 MG immediate release tablet  Commonly known as: Roxicodone  Take 1 tablet by mouth every 6 hours as needed for Pain for up to 3 days. Intended supply: 3 days. Take lowest dose possible to manage pain Max Daily Amount: 20 mg     predniSONE 20 MG tablet  Commonly known as: DELTASONE  Take 2 tablets by mouth daily for 5 days               Where to Get Your Medications        These medications were sent to [id number] [place], [place] - [address] [place] [phone] Carmon Ginsberg [phone]  [address] [place] [address]      Phone: [phone]   oxyCODONE 5 MG immediate release tablet  predniSONE 20 MG tablet           DISCONTINUED MEDICATIONS:  Discharge Medication List as of 09/30/2022  1:52 PM          I am the Primary Clinician of Record.   Gerrianne Scale, MD (electronically signed)    (Please note that parts of this dictation were completed with voice recognition software. Quite often unanticipated grammatical, syntax, homophones, and other interpretive errors are inadvertently transcribed by the computer software. Please disregards these errors. Please excuse any errors that have escaped final proofreading.)
